# Patient Record
Sex: Male | Born: 1995 | Race: Black or African American | Hispanic: No | Marital: Single | Smoking: Never smoker
Health system: Southern US, Community
[De-identification: ages and names within clinical notes are randomized; demographics above are authoritative.]

## PROBLEM LIST (undated history)

## (undated) DIAGNOSIS — S42309A Unspecified fracture of shaft of humerus, unspecified arm, initial encounter for closed fracture: Secondary | ICD-10-CM

## (undated) HISTORY — DX: Unspecified fracture of shaft of humerus, unspecified arm, initial encounter for closed fracture: S42.309A

---
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
## 2016-03-10 ENCOUNTER — Ambulatory Visit (INDEPENDENT_AMBULATORY_CARE_PROVIDER_SITE_OTHER): Payer: 59 | Admitting: Family Medicine

## 2016-03-10 VITALS — BP 110/60 | HR 73 | Temp 98.4°F | Resp 16 | Ht 74.0 in | Wt 173.0 lb

## 2016-03-10 DIAGNOSIS — Z7689 Persons encountering health services in other specified circumstances: Secondary | ICD-10-CM

## 2016-03-10 DIAGNOSIS — Z23 Encounter for immunization: Secondary | ICD-10-CM

## 2016-03-10 DIAGNOSIS — Z0000 Encounter for general adult medical examination without abnormal findings: Secondary | ICD-10-CM | POA: Diagnosis not present

## 2016-03-10 NOTE — Patient Instructions (Signed)
     IF you received an x-ray today, you will receive an invoice from [facility]. Please contact  Radiology at [phone] with questions or concerns regarding your invoice.   IF you received labwork today, you will receive an invoice from [facility]. Please contact [facility] at [phone] with questions or concerns regarding your invoice.   Our billing staff will not be able to assist you with questions regarding bills from these companies.  You will be contacted with the lab results as soon as they are available. The fastest way to get your results is to activate your My Chart account. Instructions are located on the last page of this paperwork. If you have not heard from us regarding the results in 2 weeks, please contact this office.      

## 2016-03-11 NOTE — Progress Notes (Signed)
   Subjective:    Patient ID: Donald Richards, male    DOB: 04/06/1996, 20 y.o.   MRN: [id number] Chief Complaint  Patient presents with  . Immunizations    menveo     HPI  Donald Richards is a 20 yo man here today with his mother.  He is going to start at [employer] in the fall, planning on studying business.  He brought his school immunization form to be completed and signed today. [facility] him as well. He has received all other prior immunizations here.  Immunization History  Administered Date(s) Administered  . HPV 9-valent 03/10/2016  . HPV Quadrivalent 09/29/2014  . Meningococcal Conjugate 09/29/2014   Past Medical History  Diagnosis Date  . Upper arm fracture     LEFT   No current outpatient prescriptions on file prior to visit.   No current facility-administered medications on file prior to visit.   No Known Allergies   Review of Systems  Constitutional: Negative for fever, chills, diaphoresis, activity change and appetite change.  Musculoskeletal: Negative for myalgias, joint swelling, arthralgias and gait problem.  Skin: Negative for pallor and wound.  Allergic/Immunologic: Negative for immunocompromised state.  Neurological: Negative for weakness and numbness.  Hematological: Negative for adenopathy. Does not bruise/bleed easily.       Objective:  BP 110/60 mmHg  Pulse 73  Temp(Src) 98.4 F (36.9 C) (Oral)  Resp 16  Ht 6\' 2"  (1.88 m)  Wt 173 lb (78.472 kg)  BMI 22.20 kg/m2  SpO2 100%  Physical Exam  Constitutional: He is oriented to person, place, and time. He appears well-developed and well-nourished. No distress.  HENT:  Head: Normocephalic and atraumatic.  Eyes: Conjunctivae are normal. Pupils are equal, round, and reactive to light. No scleral icterus.  Neck: Normal range of motion. Neck supple. No thyromegaly present.  Cardiovascular: Normal rate, regular rhythm, normal heart sounds and intact distal pulses.     Pulmonary/Chest: Effort normal and breath sounds normal. No respiratory distress.  Musculoskeletal: He exhibits no edema.  Lymphadenopathy:    He has no cervical adenopathy.  Neurological: He is alert and oriented to person, place, and time.  Skin: Skin is warm and dry. He is not diaphoretic.  Psychiatric: He has a normal mood and affect. His behavior is normal.          Assessment & Plan:   1. Immunizations reviewed and up to date   2. Need for HPV vaccination   College immunization form completed and scanned. Had meningitis vaccine 2 yrs prior.  Will need HPV #3 in 4 mos and tdap in 2 yrs.  Orders Placed This Encounter  Procedures  . HPV 9-valent vaccine,Recombinat    #2     Norberto SorensonEva Rennie Hack, MD MPH

## 2020-06-02 ENCOUNTER — Ambulatory Visit (HOSPITAL_COMMUNITY)
Admission: EM | Admit: 2020-06-02 | Discharge: 2020-06-02 | Disposition: A | Discharge status: Home / Self Care | Payer: 59 | Attending: Internal Medicine | Admitting: Internal Medicine

## 2020-06-02 ENCOUNTER — Encounter (HOSPITAL_COMMUNITY): Payer: Self-pay

## 2020-06-02 ENCOUNTER — Other Ambulatory Visit: Payer: Self-pay

## 2020-06-02 DIAGNOSIS — R112 Nausea with vomiting, unspecified: Secondary | ICD-10-CM | POA: Diagnosis not present

## 2020-06-02 DIAGNOSIS — R5383 Other fatigue: Secondary | ICD-10-CM | POA: Insufficient documentation

## 2020-06-02 DIAGNOSIS — Z20822 Contact with and (suspected) exposure to covid-19: Secondary | ICD-10-CM | POA: Insufficient documentation

## 2020-06-02 DIAGNOSIS — Z791 Long term (current) use of non-steroidal anti-inflammatories (NSAID): Secondary | ICD-10-CM | POA: Insufficient documentation

## 2020-06-02 DIAGNOSIS — Z79899 Other long term (current) drug therapy: Secondary | ICD-10-CM | POA: Insufficient documentation

## 2020-06-02 DIAGNOSIS — R1030 Lower abdominal pain, unspecified: Secondary | ICD-10-CM | POA: Diagnosis not present

## 2020-06-02 DIAGNOSIS — R509 Fever, unspecified: Secondary | ICD-10-CM | POA: Diagnosis not present

## 2020-06-02 DIAGNOSIS — R109 Unspecified abdominal pain: Secondary | ICD-10-CM | POA: Diagnosis not present

## 2020-06-02 DIAGNOSIS — R52 Pain, unspecified: Secondary | ICD-10-CM | POA: Diagnosis not present

## 2020-06-02 DIAGNOSIS — R21 Rash and other nonspecific skin eruption: Secondary | ICD-10-CM

## 2020-06-02 DIAGNOSIS — R110 Nausea: Secondary | ICD-10-CM | POA: Diagnosis not present

## 2020-06-02 LAB — CBC WITH DIFFERENTIAL/PLATELET
Abs Immature Granulocytes: 0 10*3/uL (ref 0.00–0.07)
Basophils Absolute: 0.1 10*3/uL (ref 0.0–0.1)
Basophils Relative: 1 %
Eosinophils Absolute: 0 10*3/uL (ref 0.0–0.5)
Eosinophils Relative: 0 %
HCT: 47.3 % (ref 39.0–52.0)
Hemoglobin: 16.5 g/dL (ref 13.0–17.0)
Lymphocytes Relative: 58 %
Lymphs Abs: 4.8 10*3/uL — ABNORMAL HIGH (ref 0.7–4.0)
MCH: 31.5 pg (ref 26.0–34.0)
MCHC: 34.9 g/dL (ref 30.0–36.0)
MCV: 90.3 fL (ref 80.0–100.0)
Monocytes Absolute: 0.2 10*3/uL (ref 0.1–1.0)
Monocytes Relative: 3 %
Neutro Abs: 3.2 10*3/uL (ref 1.7–7.7)
Neutrophils Relative %: 38 %
Platelets: 179 10*3/uL (ref 150–400)
RBC: 5.24 MIL/uL (ref 4.22–5.81)
RDW: 11.9 % (ref 11.5–15.5)
WBC: 8.3 10*3/uL (ref 4.0–10.5)
nRBC: 0 % (ref 0.0–0.2)
nRBC: 0 /100 WBC

## 2020-06-02 LAB — COMPREHENSIVE METABOLIC PANEL
ALT: 245 U/L — ABNORMAL HIGH (ref 0–44)
AST: 186 U/L — ABNORMAL HIGH (ref 15–41)
Albumin: 4 g/dL (ref 3.5–5.0)
Alkaline Phosphatase: 156 U/L — ABNORMAL HIGH (ref 38–126)
Anion gap: 10 (ref 5–15)
BUN: 5 mg/dL — ABNORMAL LOW (ref 6–20)
CO2: 26 mmol/L (ref 22–32)
Calcium: 9.2 mg/dL (ref 8.9–10.3)
Chloride: 101 mmol/L (ref 98–111)
Creatinine, Ser: 1.02 mg/dL (ref 0.61–1.24)
GFR calc Af Amer: >60 mL/min (ref >60)
GFR calc non Af Amer: >60 mL/min (ref >60)
Glucose, Bld: 88 mg/dL (ref 70–99)
Potassium: 3.7 mmol/L (ref 3.5–5.1)
Sodium: 137 mmol/L (ref 135–145)
Total Bilirubin: 2 mg/dL — ABNORMAL HIGH (ref 0.3–1.2)
Total Protein: 7.9 g/dL (ref 6.5–8.1)

## 2020-06-02 LAB — POCT URINALYSIS DIP (DEVICE)
Bilirubin Urine: NEGATIVE
Glucose, UA: NEGATIVE mg/dL
Hgb urine dipstick: NEGATIVE
Ketones, ur: NEGATIVE mg/dL
Leukocytes,Ua: NEGATIVE
Nitrite: NEGATIVE
Protein, ur: NEGATIVE mg/dL
Specific Gravity, Urine: <=1.005 (ref 1.005–1.030)
Urobilinogen, UA: 0.2 mg/dL (ref 0.0–1.0)
pH: 7 (ref 5.0–8.0)

## 2020-06-02 LAB — POCT RAPID STREP A: Streptococcus, Group A Screen (Direct): NEGATIVE

## 2020-06-02 LAB — CK: Total CK: 101 U/L (ref 49–397)

## 2020-06-02 LAB — SEDIMENTATION RATE: Sed Rate: 4 mm/hr (ref 0–16)

## 2020-06-02 LAB — C-REACTIVE PROTEIN: CRP: 2.3 mg/dL — ABNORMAL HIGH (ref <1.0)

## 2020-06-02 MED ORDER — NAPROXEN 500 MG PO TABS: 500.0000 mg | ORAL_TABLET | Freq: Two times a day (BID) | ORAL | 0 refills | Status: AC

## 2020-06-02 MED ORDER — ACETAMINOPHEN 325 MG PO TABS
ORAL_TABLET | ORAL | Status: AC
  Filled 2020-06-02: qty 3

## 2020-06-02 MED ORDER — ONDANSETRON 4 MG PO TBDP: 4.0000 mg | ORAL_TABLET | Freq: Three times a day (TID) | ORAL | 0 refills | Status: AC | PRN

## 2020-06-02 MED ORDER — TRIAMCINOLONE ACETONIDE 0.1 % EX CREA: 1.0000 "application " | TOPICAL_CREAM | Freq: Two times a day (BID) | CUTANEOUS | 0 refills | Status: AC

## 2020-06-02 MED ORDER — ACETAMINOPHEN 325 MG PO TABS
975.0000 mg | ORAL_TABLET | Freq: Once | ORAL | Status: AC
  Administered 2020-06-02: 975 mg via ORAL

## 2020-06-02 NOTE — ED Triage Notes (Signed)
Pt reports groin pain x 3 days and dark orange urine x 3 days. Denies dysuria, trauma.

## 2020-06-02 NOTE — ED Provider Notes (Signed)
[facility]    CSN: [id number] Arrival date & time: 06/02/20  1329      History   Chief Complaint Chief Complaint  Patient presents with   Groin Pain    HPI Donald Richards is a 24 y.o. male no significant past medical history presenting today for evaluation of fever, body aches, headache, dark urine.  Patient reports last week he had some diarrhea which cleared up over the weekend.  Beginning Monday he has had headaches, hot and cold chills, body aches and significant fatigue.  He reports feeling very weak.  Body aches are mainly in his lower extremities and thigh area.  He denies any injury or trauma.  Does work for [employer] and often is lifting heavy.  Yesterday he noticed his urine was dark orange.  Denies any dysuria or penile discharge.  Does report some occasional testicular ache.  Denies testicular swelling.  Said some mild groin pain.  Has also noticed a rash develop to his right shoulder, low back and left groin.  Has some sensitivity associated with this and has been applying hydrocortisone.  Denies any new soaps or lotions, hygiene products.  Patient is sexually active only with females.  Denies new partners.  Denies any recent bites or tick bites.  Patient is not vaccinated for Covid, but does report tested positive for Covid and December 2020.  HPI  Past Medical History:  Diagnosis Date   Upper arm fracture    LEFT    There are no problems to display for this patient.   History reviewed. No pertinent surgical history.     Home Medications    Prior to Admission medications   Medication Sig Start Date End Date Taking? Authorizing Provider  naproxen (NAPROSYN) 500 MG tablet Take 1 tablet (500 mg total) by mouth 2 (two) times daily. 06/02/20   Jeffren Dombek C, PA-C  ondansetron (ZOFRAN ODT) 4 MG disintegrating tablet Take 1 tablet (4 mg total) by mouth every 8 (eight) hours as needed for nausea or vomiting. 06/02/20   Laterria Lasota C, PA-C  triamcinolone  cream (KENALOG) 0.1 % Apply 1 application topically 2 (two) times daily. 06/02/20   Tyson Masin, Elesa Hacker, PA-C    Family History History reviewed. No pertinent family history.  Social History Social History   Tobacco Use   Smoking status: Never Smoker   Smokeless tobacco: Never Used  Substance Use Topics   Alcohol use: No   Drug use: No     Allergies   Patient has no known allergies.   Review of Systems Review of Systems  Constitutional: Positive for appetite change, chills, fatigue and fever. Negative for activity change.  HENT: Negative for congestion, ear pain, rhinorrhea, sinus pressure, sore throat and trouble swallowing.   Eyes: Negative for discharge and redness.  Respiratory: Negative for cough, chest tightness and shortness of breath.   Cardiovascular: Negative for chest pain.  Gastrointestinal: Positive for nausea. Negative for abdominal pain, diarrhea and vomiting.  Musculoskeletal: Positive for myalgias.  Skin: Positive for color change and rash.  Neurological: Positive for headaches. Negative for dizziness and light-headedness.     Physical Exam Triage Vital Signs ED Triage Vitals  Enc Vitals Group     BP 06/02/20 1350 120/70     Pulse Rate 06/02/20 1350 85     Resp 06/02/20 1350 18     Temp 06/02/20 1350 (!) 100.9 F (38.3 C)     Temp Source 06/02/20 1350 Oral     SpO2  06/02/20 1350 100 %     Weight --      Height --      Head Circumference --      Peak Flow --      Pain Score 06/02/20 1349 3     Pain Loc --      Pain Edu? --      Excl. in [place]? --    No data found.  Updated Vital Signs BP 120/70 (BP Location: Right Arm)    Pulse 85    Temp (!) 100.9 F (38.3 C) (Oral)    Resp 18    SpO2 100%   Visual Acuity Right Eye Distance:   Left Eye Distance:   Bilateral Distance:    Right Eye Near:   Left Eye Near:    Bilateral Near:     Physical Exam Vitals and nursing note reviewed.  Constitutional:      Appearance: He is well-developed.       Comments: No acute distress  HENT:     Head: Normocephalic and atraumatic.     Ears:     Comments: Bilateral ears without tenderness to palpation of external auricle, tragus and mastoid, EAC's without erythema or swelling, TM's with good bony landmarks and cone of light. Non erythematous.     Nose: Nose normal.     Mouth/Throat:     Comments: Oral mucosa pink and moist, exudate present on right tonsil,. Posterior pharynx patent and nonerythematous, no uvula deviation or swelling. Normal phonation. Eyes:     Extraocular Movements: Extraocular movements intact.     Conjunctiva/sclera: Conjunctivae normal.     Pupils: Pupils are equal, round, and reactive to light.  Cardiovascular:     Rate and Rhythm: Normal rate and regular rhythm.  Pulmonary:     Effort: Pulmonary effort is normal. No respiratory distress.     Comments: Breathing comfortably at rest, CTABL, no wheezing, rales or other adventitious sounds auscultated Abdominal:     General: There is no distension.     Comments: Soft, no nondistended, nontender to light and deep palpation throughout abdomen  Genitourinary:    Comments: Lymph nodes palpable to bilateral groin areas, no overlying swelling tenderness or erythema Musculoskeletal:        General: Normal range of motion.     Cervical back: Neck supple.  Skin:    General: Skin is warm and dry.     Comments: Faint erythematous finely papular rash noted to posterior right shoulder, bilateral lower back/lumbar area left groin  Neurological:     Mental Status: He is alert and oriented to person, place, and time.      UC Treatments / Results  Labs (all labs ordered are listed, but only abnormal results are displayed) Labs Reviewed  CULTURE, GROUP A STREP ([place])  SARS CORONAVIRUS 2 (TAT 6-24 HRS)  CBC WITH DIFFERENTIAL/PLATELET  COMPREHENSIVE METABOLIC PANEL  CK  SEDIMENTATION RATE  C-REACTIVE PROTEIN  POCT URINALYSIS DIP (DEVICE)  POCT RAPID STREP A     EKG   Radiology No results found.  Procedures Procedures (including critical care time)  Medications Ordered in UC Medications  acetaminophen (TYLENOL) tablet 975 mg (975 mg Oral Given 06/02/20 1622)    Initial Impression / Assessment and Plan / UC Course  I have reviewed the triage vital signs and the nursing notes.  Pertinent labs & imaging results that were available during my care of the patient were reviewed by me and considered in my medical decision making (  see chart for details).     Strep test negative, Covid pending.  Patient with fever and vague nonspecific complaints today.  UA unremarkable.  Given reported urinary symptoms along with fever and rash drawing blood work to check CBC, CMP, CK, ESR and CRP.  Will call with results and alter plan as needed.  Treating symptomatically and supportively for now, push fluids, Naprosyn for body aches, Zofran for nausea, triamcinolone for rash.  Close monitoring,Discussed strict return precautions. Patient verbalized understanding and is agreeable with plan.  Final Clinical Impressions(s) / UC Diagnoses   Final diagnoses:  Fever, unspecified  Body aches  Rash and nonspecific skin eruption  Nausea without vomiting     Discharge Instructions     Strep test negative, Covid test pending Blood work pending-I will call with these results later today Please continue to drink plenty of fluids and hydrate May use Naprosyn as needed for body aches May apply triamcinolone cream topically to areas of rash twice daily Zofran as needed for nausea  Please continue to keep an eye on your symptoms, follow-up if not improving or worsening    ED Prescriptions    Medication Sig Dispense Auth. Provider   naproxen (NAPROSYN) 500 MG tablet Take 1 tablet (500 mg total) by mouth 2 (two) times daily. 30 tablet Serenitee Fuertes C, PA-C   ondansetron (ZOFRAN ODT) 4 MG disintegrating tablet Take 1 tablet (4 mg total) by mouth every 8 (eight)  hours as needed for nausea or vomiting. 20 tablet Bowie Delia C, PA-C   triamcinolone cream (KENALOG) 0.1 % Apply 1 application topically 2 (two) times daily. 30 g Dosia Yodice, [place] C, PA-C     PDMP not reviewed this encounter.   Janith Lima, [place] 06/02/20 1657

## 2020-06-02 NOTE — Discharge Instructions (Addendum)
Strep test negative, Covid test pending Blood work pending-I will call with these results later today Please continue to drink plenty of fluids and hydrate May use Naprosyn as needed for body aches May apply triamcinolone cream topically to areas of rash twice daily Zofran as needed for nausea  Please continue to keep an eye on your symptoms, follow-up if not improving or worsening

## 2020-06-03 ENCOUNTER — Telehealth (HOSPITAL_COMMUNITY): Payer: Self-pay | Admitting: Emergency Medicine

## 2020-06-03 LAB — SARS CORONAVIRUS 2 (TAT 6-24 HRS): SARS Coronavirus 2: NEGATIVE

## 2020-06-03 NOTE — Telephone Encounter (Signed)
Called patient and discussed results, elevated liver enzymes and leukocytosis, this in combination with history suspicious of mono as cause of fever and symptoms. Recommending symptomatic and supportive care rest and fluids, follow-up in 2 weeks for reevaluation of liver enzymes. Follow-up sooner if developing abdominal pain, worsening symptoms.  Smear review and Covid still pending.

## 2020-06-04 LAB — CULTURE, GROUP A STREP (THRC)

## 2020-06-04 LAB — PATHOLOGIST SMEAR REVIEW: Path Review: REACTIVE
